# Patient Record
Sex: Male | Born: 1990 | Race: White | Hispanic: Yes | Marital: Single | Smoking: Never smoker
Health system: Southern US, Community
[De-identification: ages and names within clinical notes are randomized; demographics above are authoritative.]

---

## 2017-02-16 ENCOUNTER — Ambulatory Visit (HOSPITAL_COMMUNITY)
Admission: EM | Admit: 2017-02-16 | Discharge: 2017-02-16 | Disposition: A | Discharge status: Home / Self Care | Payer: Federal, State, Local not specified - PPO | Attending: Internal Medicine | Admitting: Internal Medicine

## 2017-02-16 ENCOUNTER — Encounter (HOSPITAL_COMMUNITY): Payer: Self-pay | Admitting: Family Medicine

## 2017-02-16 DIAGNOSIS — L739 Follicular disorder, unspecified: Secondary | ICD-10-CM

## 2017-02-16 DIAGNOSIS — R21 Rash and other nonspecific skin eruption: Secondary | ICD-10-CM | POA: Diagnosis not present

## 2017-02-16 MED ORDER — MUPIROCIN CALCIUM 2 % EX CREA: 1.0000 "application " | TOPICAL_CREAM | Freq: Two times a day (BID) | CUTANEOUS | 0 refills | Status: AC

## 2017-02-16 NOTE — ED Triage Notes (Signed)
Pt here for abscess to right arm and left leg. sts also something that comes and goes on penis.

## 2017-02-16 NOTE — Discharge Instructions (Addendum)
I believe your rash will resolve with antibiotic ointment. The papule on the testicle is most likely folliculitis. Please follow up with your primary care doctor if you do not improve.

## 2017-02-16 NOTE — ED Provider Notes (Signed)
CSN: [id number]     Arrival date & time 02/16/17  1828 History   First MD Initiated Contact with Patient 02/16/17 2022     Chief Complaint  Patient presents with  . Abscess   (Consider location/radiation/quality/duration/timing/severity/associated sxs/prior Treatment) Patient is a healthy 26 y.o. Male, presents today with concern for a skin infection on his right upper arm and left anterior thigh. Patient noticed a small pimple on his thigh 2 days ago but the area is getting more erythematous today and he also noticed the same rash on the arm this morning. He denies fever. Rash is not pruritic. Rash is a slightly sore to the touch but tolerable. Current pain is 0/10.   Patient also reports to have a bump on the bottom of his testicle that comes and goes and this bump would occasionally turn into a erythematous pruritic rash. He would like this bump to be evaluated today as well.       History reviewed. No pertinent past medical history. History reviewed. No pertinent surgical history. History reviewed. No pertinent family history. Social History  Substance Use Topics  . Smoking status: Never Smoker  . Smokeless tobacco: Never Used  . Alcohol use Not on file    Review of Systems  Constitutional:       As stated in the HPI    Allergies  Patient has no known allergies.  Home Medications   Prior to Admission medications   Medication Sig Start Date End Date Taking? Authorizing Provider  mupirocin cream (BACTROBAN) 2 % Apply 1 application topically 2 (two) times daily. 02/16/17 02/23/17  Lucia Estelle, NP   Meds Ordered and Administered this Visit  Medications - No data to display  BP 126/69   Pulse 69   Temp 98.4 F (36.9 C)   Resp 18   SpO2 97%  No data found.   Physical Exam  Constitutional: He is oriented to person, place, and time. He appears well-developed and well-nourished.  Cardiovascular: Normal rate, regular rhythm and normal heart sounds.   Pulmonary/Chest:  Effort normal and breath sounds normal. No respiratory distress.  Abdominal: Soft. Bowel sounds are normal. He exhibits no distension.  Genitourinary:  Genitourinary Comments: Penis normal. No testicular swelling or swelling. No penile rash noted. There is a single small flesh-color papule on the mid bottom testicle at the area of the pubic hair, no inflammation or swelling or discharge.   Neurological: He is alert and oriented to person, place, and time.  Skin: Skin is warm and dry.  See picture below. Exact same rash noted on left anterior thigh.   See Genitourinary for penile exam.   Nursing note and vitals reviewed.     Urgent Care Course     Procedures (including critical care time)  Labs Review Labs Reviewed - No data to display  Imaging Review No results found.  MDM   1. Rash    Differential includes folliculitis or insect bite for the rash on the arm and the thigh. The papule located on the testicle is most likely folliculitis; doubt HSV.   Apply antibiotic ointment to the areas BID x 7 days. Please follow up with your primary care doctor for no improvement.   Cleaning the blade next time prior to shaving. May try to wipe some alcohol after shaving to minimize chance of infection.     Lucia Estelle, NP 02/16/17 2043

## 2017-10-07 ENCOUNTER — Emergency Department (HOSPITAL_COMMUNITY)
Admission: EM | Admit: 2017-10-07 | Discharge: 2017-10-07 | Disposition: A | Discharge status: Home / Self Care | Payer: Worker's Compensation | Attending: Emergency Medicine | Admitting: Emergency Medicine

## 2017-10-07 ENCOUNTER — Emergency Department (HOSPITAL_COMMUNITY): Payer: Worker's Compensation

## 2017-10-07 ENCOUNTER — Encounter (HOSPITAL_COMMUNITY): Payer: Self-pay | Admitting: Emergency Medicine

## 2017-10-07 DIAGNOSIS — M25511 Pain in right shoulder: Secondary | ICD-10-CM | POA: Diagnosis not present

## 2017-10-07 MED ORDER — CYCLOBENZAPRINE HCL 10 MG PO TABS: 10.0000 mg | ORAL_TABLET | Freq: Every evening | ORAL | 0 refills | Status: DC | PRN

## 2017-10-07 MED ORDER — NAPROXEN 500 MG PO TABS: 500.0000 mg | ORAL_TABLET | Freq: Two times a day (BID) | ORAL | 0 refills | Status: DC

## 2017-10-07 NOTE — ED Triage Notes (Signed)
Pt reports he began to have R shoulder pain after lifting a heavy box. Pt has hx of shoulder dislocation, but does not feel that bad.

## 2017-10-07 NOTE — ED Provider Notes (Signed)
[facility]-EMERGENCY DEPT Provider Note   CSN: [id number] Arrival date & time: 10/07/17  1424     History   Chief Complaint Chief Complaint  Patient presents with  . Shoulder Pain    HPI Jeffrey Berger is a 26 y.o. male who presents to the ED with shoulder pain. The pain is located in the right shoulder and started after lifting a heavy box. Patient has hx of shoulder dislocation but states it does not feel that bad. Patient states that he was straining to reach a box on the top shelf and lifted the box up to move it and felt a sharp in in the back of the right shoulder.   HPI  History reviewed. No pertinent past medical history.  There are no active problems to display for this patient.   History reviewed. No pertinent surgical history.     Home Medications    Prior to Admission medications   Medication Sig Start Date End Date Taking? Authorizing Provider  cyclobenzaprine (FLEXERIL) 10 MG tablet Take 1 tablet (10 mg total) by mouth at bedtime as needed for muscle spasms. 10/07/17   Janne NapoleonNeese, Jaeda Bruso M, NP  naproxen (NAPROSYN) 500 MG tablet Take 1 tablet (500 mg total) by mouth 2 (two) times daily. 10/07/17   Janne NapoleonNeese, Kenneisha Cochrane M, NP    Family History History reviewed. No pertinent family history.  Social History Social History   Tobacco Use  . Smoking status: Never Smoker  . Smokeless tobacco: Never Used  Substance Use Topics  . Alcohol use: Not on file  . Drug use: Not on file     Allergies   Patient has no known allergies.   Review of Systems Review of Systems  Musculoskeletal: Positive for arthralgias.       Right shoulder  All other systems reviewed and are negative.    Physical Exam Updated Vital Signs BP 126/86   Pulse 97   Temp 97.8 F (36.6 C) (Oral)   Resp 16   SpO2 96%   Physical Exam  Constitutional: He appears well-developed and well-nourished. No distress.  HENT:  Head: Normocephalic and atraumatic.  Eyes: EOM are  normal.  Neck: Normal range of motion. Neck supple.  Cardiovascular: Normal rate.  Pulmonary/Chest: Effort normal.  Musculoskeletal:       Right shoulder: He exhibits tenderness and spasm. He exhibits normal range of motion, no swelling, no deformity, no laceration, normal pulse and normal strength.  Radial pulses 2+, adequate circulation, grips are equal.   Neurological: He is alert.  Skin: Skin is warm and dry.  Nursing note and vitals reviewed.    ED Treatments / Results  Labs (all labs ordered are listed, but only abnormal results are displayed) Labs Reviewed - No data to display  Radiology Dg Shoulder Right  Result Date: 10/07/2017 CLINICAL DATA:  Injury.  Pain. EXAM: RIGHT SHOULDER - 2+ VIEW COMPARISON:  None. FINDINGS: No acute fracture. No dislocation.  Unremarkable soft tissues. IMPRESSION: No acute bony pathology. Electronically Signed   By: Jolaine ClickArthur  Hoss M.D.   On: 10/07/2017 15:09    Procedures Procedures (including critical care time)  Medications Ordered in ED Medications - No data to display   Initial Impression / Assessment and Plan / ED Course  I have reviewed the triage vital signs and the nursing notes.  26 y.o. male with right shoulder pain and spasm s/p injury while lifting stable for d/c without fracture or dislocation noted on x-ray. Patient without focal neuro  deficits. Return precautions discussed. Will treat for muscle spasm and pain.  Final Clinical Impressions(s) / ED Diagnoses   Final diagnoses:  Acute pain of right shoulder    ED Discharge Orders        Ordered    cyclobenzaprine (FLEXERIL) 10 MG tablet  At bedtime PRN     10/07/17 1842    naproxen (NAPROSYN) 500 MG tablet  2 times daily     10/07/17 1842       Kerrie Buffaloeese, Neriyah Cercone HughesvilleM, NP 10/07/17 1847    Maia PlanLong, Joshua G, MD 10/08/17 1151

## 2018-03-16 IMAGING — CR DG SHOULDER 2+V*R*
3 series · 3 of 3 positions shown · non-contrast
Comparison: None.

CLINICAL DATA: Injury.  Pain.

EXAM:
RIGHT SHOULDER - 2+ VIEW

[w shoulder external right]
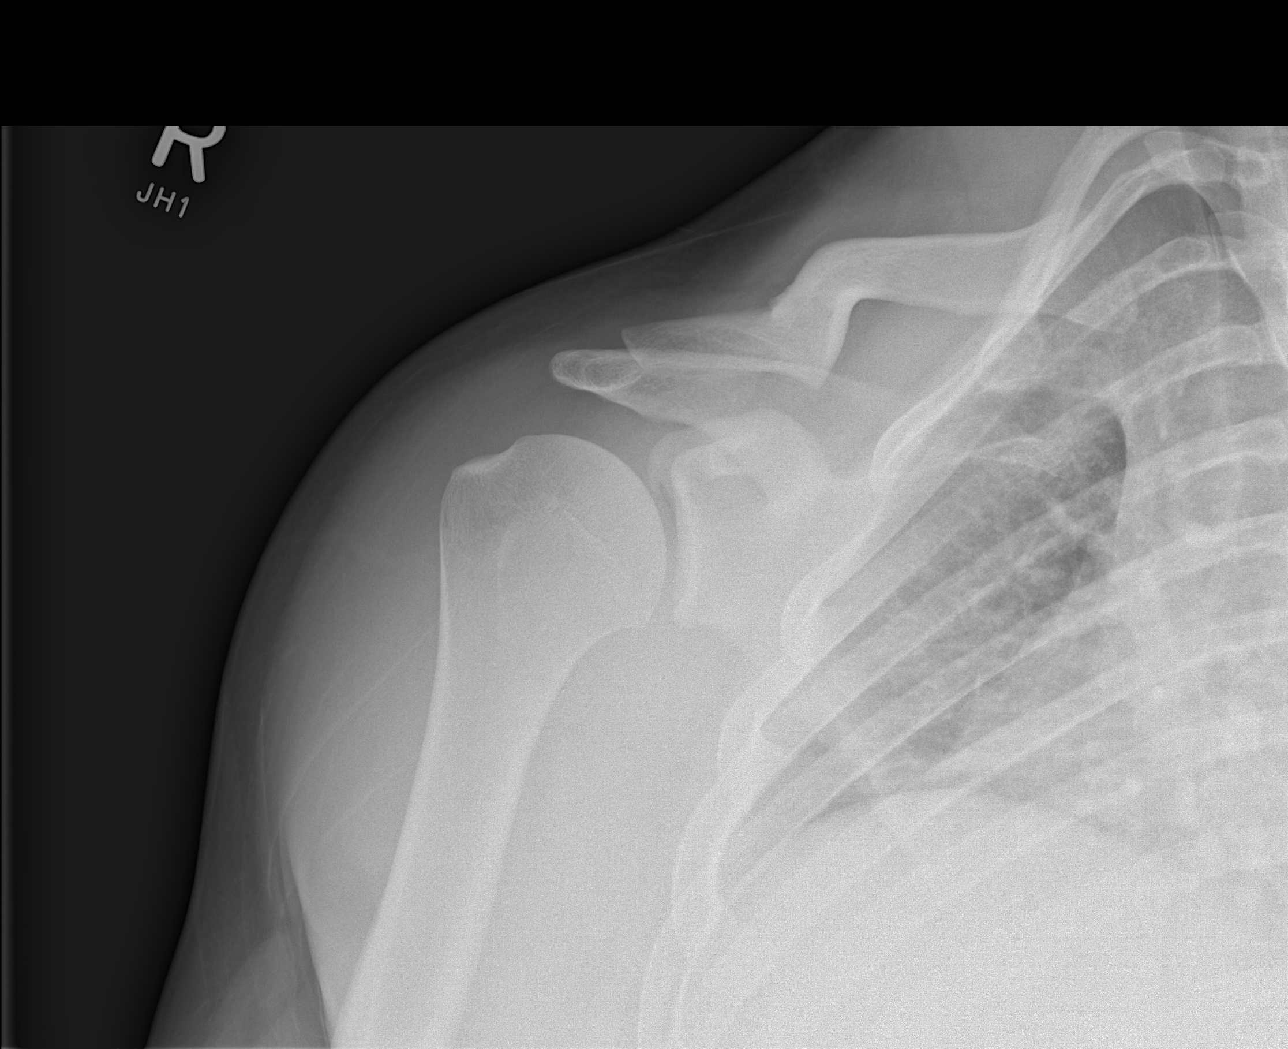

[w shoulder y-view right]
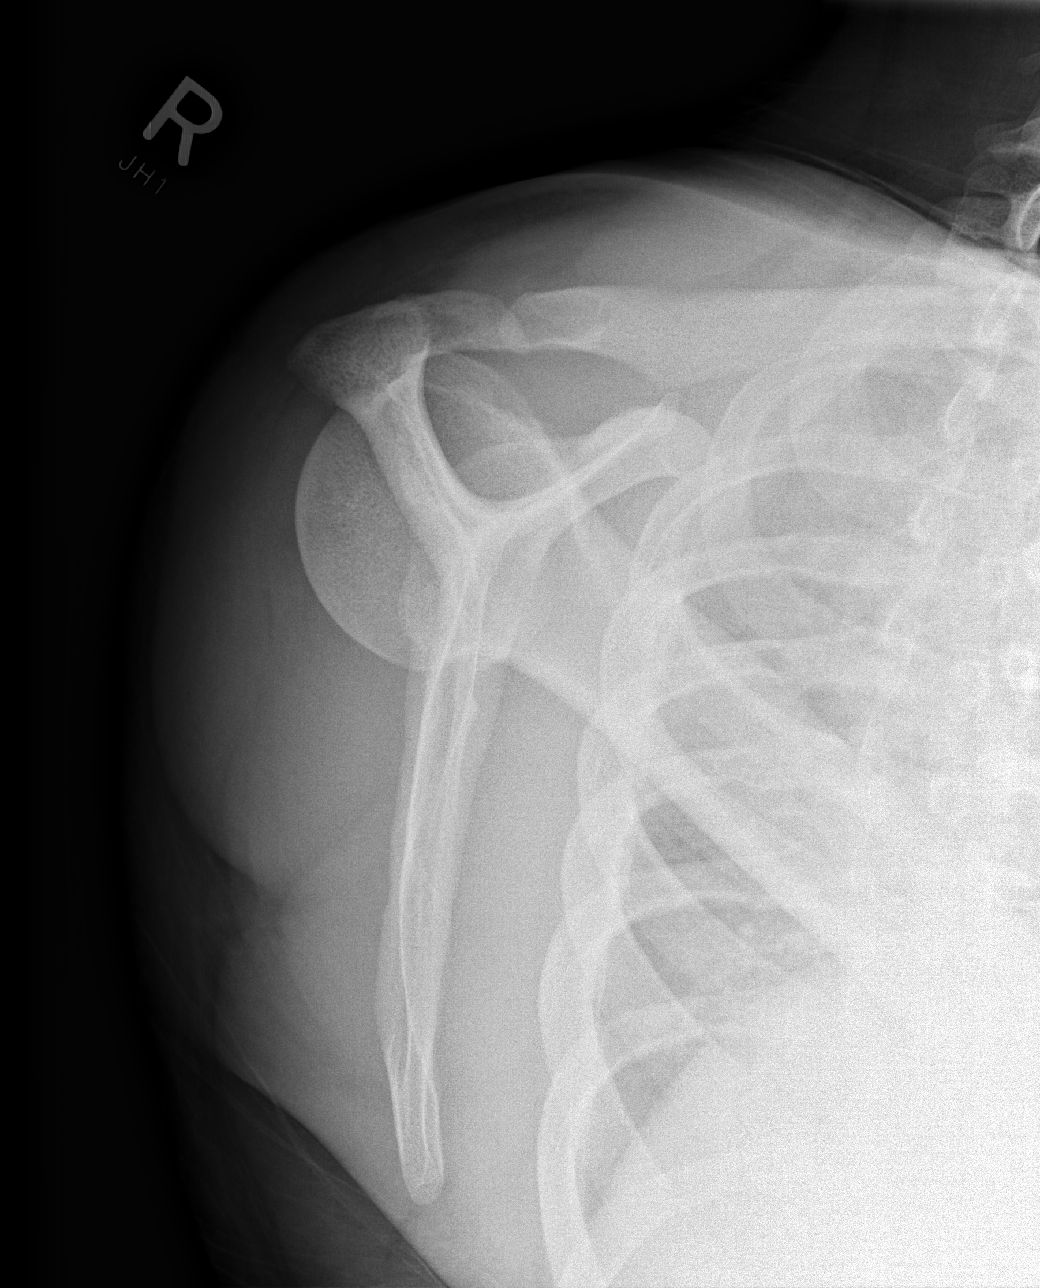

[x shoulder axillary right]
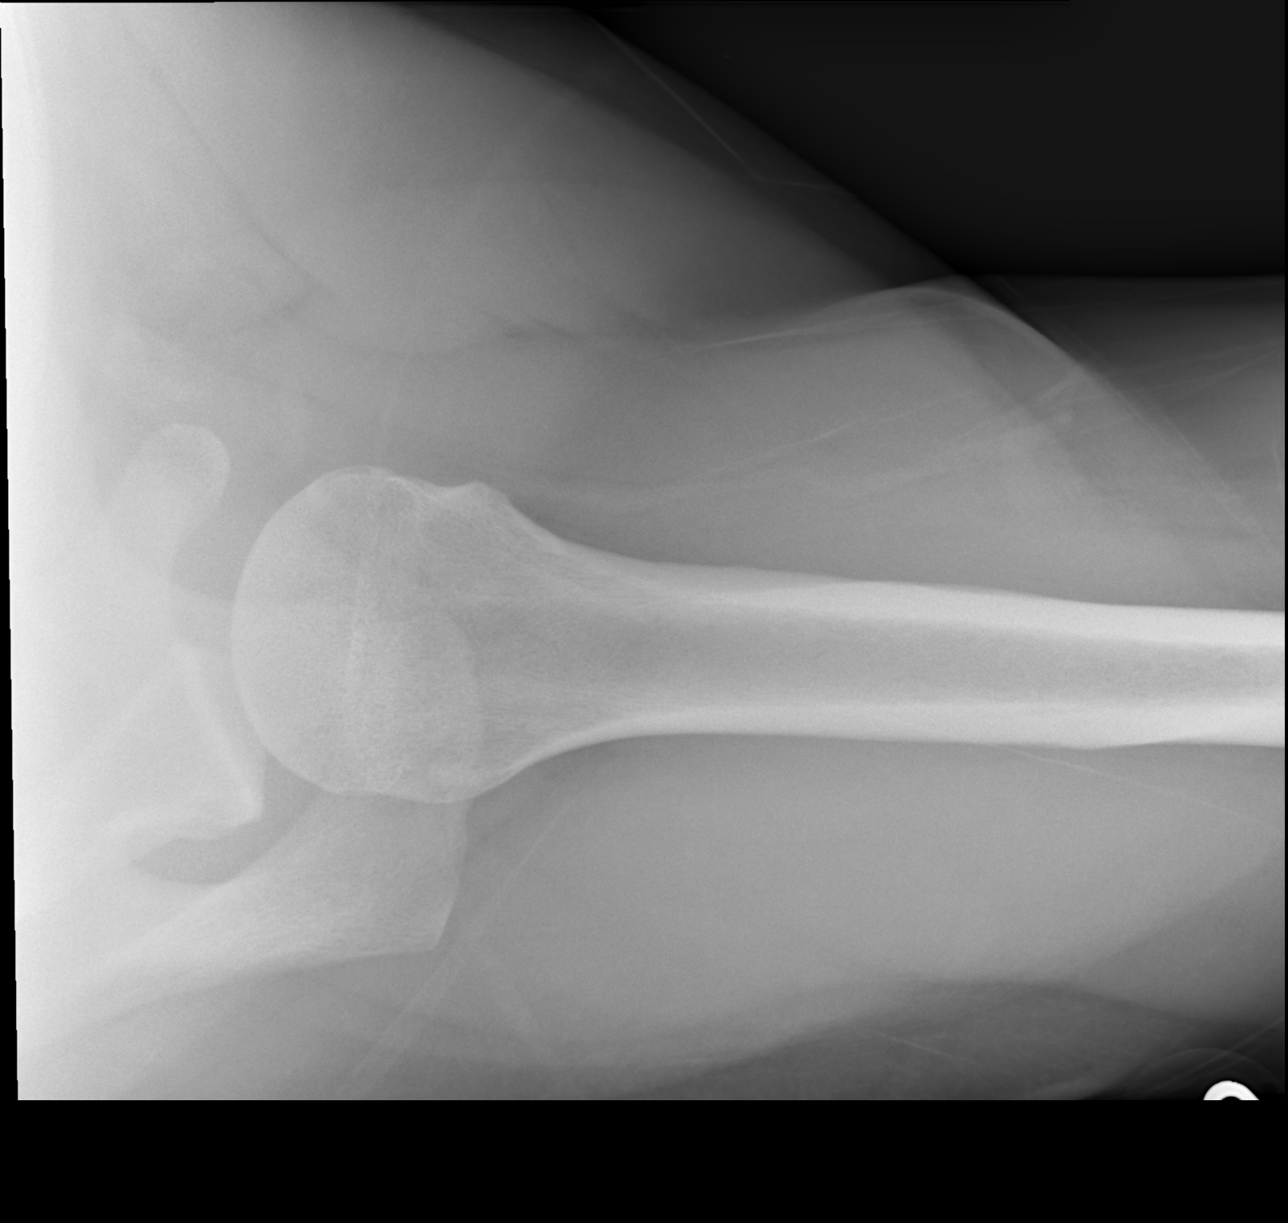

[3 of 3 positions shown; findings below may reference images not displayed]

FINDINGS: No acute fracture. No dislocation.  Unremarkable soft tissues.
IMPRESSION: No acute bony pathology.

## 2019-12-29 ENCOUNTER — Ambulatory Visit (INDEPENDENT_AMBULATORY_CARE_PROVIDER_SITE_OTHER): Payer: Managed Care, Other (non HMO) | Admitting: Family Medicine

## 2019-12-29 ENCOUNTER — Encounter: Payer: Self-pay | Admitting: Family Medicine

## 2019-12-29 ENCOUNTER — Other Ambulatory Visit: Payer: Self-pay

## 2019-12-29 VITALS — BP 122/68 | HR 97 | Temp 97.1°F | Ht 65.0 in | Wt 214.2 lb

## 2019-12-29 DIAGNOSIS — Z23 Encounter for immunization: Secondary | ICD-10-CM | POA: Diagnosis not present

## 2019-12-29 DIAGNOSIS — Z0000 Encounter for general adult medical examination without abnormal findings: Secondary | ICD-10-CM

## 2019-12-29 NOTE — Patient Instructions (Signed)
Health Maintenance, Male Adopting a healthy lifestyle and getting preventive care are important in promoting health and wellness. Ask your health care provider about:  The right schedule for you to have regular tests and exams.  Things you can do on your own to prevent diseases and keep yourself healthy. What should I know about diet, weight, and exercise? Eat a healthy diet   Eat a diet that includes plenty of vegetables, fruits, low-fat dairy products, and lean protein.  Do not eat a lot of foods that are high in solid fats, added sugars, or sodium. Maintain a healthy weight Body mass index (BMI) is a measurement that can be used to identify possible weight problems. It estimates body fat based on height and weight. Your health care provider can help determine your BMI and help you achieve or maintain a healthy weight. Get regular exercise Get regular exercise. This is one of the most important things you can do for your health. Most adults should:  Exercise for at least 150 minutes each week. The exercise should increase your heart rate and make you sweat (moderate-intensity exercise).  Do strengthening exercises at least twice a week. This is in addition to the moderate-intensity exercise.  Spend less time sitting. Even light physical activity can be beneficial. Watch cholesterol and blood lipids Have your blood tested for lipids and cholesterol at 27 years of age, then have this test every 5 years. You may need to have your cholesterol levels checked more often if:  Your lipid or cholesterol levels are high.  You are older than 27 years of age.  You are at high risk for heart disease. What should I know about cancer screening? Many types of cancers can be detected early and may often be prevented. Depending on your health history and family history, you may need to have cancer screening at various ages. This may include screening for:  Colorectal cancer.  Prostate  cancer.  Skin cancer.  Lung cancer. What should I know about heart disease, diabetes, and high blood pressure? Blood pressure and heart disease  High blood pressure causes heart disease and increases the risk of stroke. This is more likely to develop in people who have high blood pressure readings, are of African descent, or are overweight.  Talk with your health care provider about your target blood pressure readings.  Have your blood pressure checked: ? Every 3-5 years if you are 27-39 years of age. ? Every year if you are 40 years old or older.  If you are between the ages of 65 and 75 and are a current or former smoker, ask your health care provider if you should have a one-time screening for abdominal aortic aneurysm (AAA). Diabetes Have regular diabetes screenings. This checks your fasting blood sugar level. Have the screening done:  Once every three years after age 45 if you are at a normal weight and have a low risk for diabetes.  More often and at a younger age if you are overweight or have a high risk for diabetes. What should I know about preventing infection? Hepatitis B If you have a higher risk for hepatitis B, you should be screened for this virus. Talk with your health care provider to find out if you are at risk for hepatitis B infection. Hepatitis C Blood testing is recommended for:  Everyone born from 1945 through 1965.  Anyone with known risk factors for hepatitis C. Sexually transmitted infections (STIs)  You should be screened each year   for STIs, including gonorrhea and chlamydia, if: ? You are sexually active and are younger than 27 years of age. ? You are older than 27 years of age and your health care provider tells you that you are at risk for this type of infection. ? Your sexual activity has changed since you were last screened, and you are at increased risk for chlamydia or gonorrhea. Ask your health care provider if you are at risk.  Ask your  health care provider about whether you are at high risk for HIV. Your health care provider may recommend a prescription medicine to help prevent HIV infection. If you choose to take medicine to prevent HIV, you should first get tested for HIV. You should then be tested every 3 months for as long as you are taking the medicine. Follow these instructions at home: Lifestyle  Do not use any products that contain nicotine or tobacco, such as cigarettes, e-cigarettes, and chewing tobacco. If you need help quitting, ask your health care provider.  Do not use street drugs.  Do not share needles.  Ask your health care provider for help if you need support or information about quitting drugs. Alcohol use  Do not drink alcohol if your health care provider tells you not to drink.  If you drink alcohol: ? Limit how much you have to 0-2 drinks a day. ? Be aware of how much alcohol is in your drink. In the [place] one drink equals one 12 oz bottle of beer (355 mL), one 5 oz glass of wine (148 mL), or one 1 oz glass of hard liquor (44 mL). General instructions  Schedule regular health, dental, and eye exams.  Stay current with your vaccines.  Tell your health care provider if: ? You often feel depressed. ? You have ever been abused or do not feel safe at home. Summary  Adopting a healthy lifestyle and getting preventive care are important in promoting health and wellness.  Follow your health care provider's instructions about healthy diet, exercising, and getting tested or screened for diseases.  Follow your health care provider's instructions on monitoring your cholesterol and blood pressure. This information is not intended to replace advice given to you by your health care provider. Make sure you discuss any questions you have with your health care provider. Document Revised: 10/22/2018 Document Reviewed: 10/22/2018 Elsevier Patient Education  2020 [employer]vier Inc.  Preventive Care 21-39 Years  Old, Male Preventive care refers to lifestyle choices and visits with your health care provider that can promote health and wellness. This includes:  A yearly physical exam. This is also called an annual well check.  Regular dental and eye exams.  Immunizations.  Screening for certain conditions.  Healthy lifestyle choices, such as eating a healthy diet, getting regular exercise, not using drugs or products that contain nicotine and tobacco, and limiting alcohol use. What can I expect for my preventive care visit? Physical exam Your health care provider will check:  Height and weight. These may be used to calculate body mass index (BMI), which is a measurement that tells if you are at a healthy weight.  Heart rate and blood pressure.  Your skin for abnormal spots. Counseling Your health care provider may ask you questions about:  Alcohol, tobacco, and drug use.  Emotional well-being.  Home and relationship well-being.  Sexual activity.  Eating habits.  Work and work environment. What immunizations do I need?  Influenza (flu) vaccine  This is recommended every year. Tetanus, diphtheria,   and pertussis (Tdap) vaccine  You may need a Td booster every 10 years. Varicella (chickenpox) vaccine  You may need this vaccine if you have not already been vaccinated. Human papillomavirus (HPV) vaccine  If recommended by your health care provider, you may need three doses over 6 months. Measles, mumps, and rubella (MMR) vaccine  You may need at least one dose of MMR. You may also need a second dose. Meningococcal conjugate (MenACWY) vaccine  One dose is recommended if you are 73-28 years old and a Market researcher living in a residence hall, or if you have one of several medical conditions. You may also need additional booster doses. Pneumococcal conjugate (PCV13) vaccine  You may need this if you have certain conditions and were not previously  vaccinated. Pneumococcal polysaccharide (PPSV23) vaccine  You may need one or two doses if you smoke cigarettes or if you have certain conditions. Hepatitis A vaccine  You may need this if you have certain conditions or if you travel or work in places where you may be exposed to hepatitis A. Hepatitis B vaccine  You may need this if you have certain conditions or if you travel or work in places where you may be exposed to hepatitis B. Haemophilus influenzae type b (Hib) vaccine  You may need this if you have certain risk factors. You may receive vaccines as individual doses or as more than one vaccine together in one shot (combination vaccines). Talk with your health care provider about the risks and benefits of combination vaccines. What tests do I need? Blood tests  Lipid and cholesterol levels. These may be checked every 5 years starting at age 80.  Hepatitis C test.  Hepatitis B test. Screening   Diabetes screening. This is done by checking your blood sugar (glucose) after you have not eaten for a while (fasting).  Sexually transmitted disease (STD) testing. Talk with your health care provider about your test results, treatment options, and if necessary, the need for more tests. Follow these instructions at home: Eating and drinking   Eat a diet that includes fresh fruits and vegetables, whole grains, lean protein, and low-fat dairy products.  Take vitamin and mineral supplements as recommended by your health care provider.  Do not drink alcohol if your health care provider tells you not to drink.  If you drink alcohol: ? Limit how much you have to 0-2 drinks a day. ? Be aware of how much alcohol is in your drink. In the [place] one drink equals one 12 oz bottle of beer (355 mL), one 5 oz glass of wine (148 mL), or one 1 oz glass of hard liquor (44 mL). Lifestyle  Take daily care of your teeth and gums.  Stay active. Exercise for at least 30 minutes on 5 or more days  each week.  Do not use any products that contain nicotine or tobacco, such as cigarettes, e-cigarettes, and chewing tobacco. If you need help quitting, ask your health care provider.  If you are sexually active, practice safe sex. Use a condom or other form of protection to prevent STIs (sexually transmitted infections). What's next?  Go to your health care provider once a year for a well check visit.  Ask your health care provider how often you should have your eyes and teeth checked.  Stay up to date on all vaccines. This information is not intended to replace advice given to you by your health care provider. Make sure you discuss any questions you  have with your health care provider. Document Revised: 10/23/2018 Document Reviewed: 10/23/2018 Elsevier Patient Education  2020 Elsevier Inc.  Exercising to Lose Weight Exercise is structured, repetitive physical activity to improve fitness and health. Getting regular exercise is important for everyone. It is especially important if you are overweight. Being overweight increases your risk of heart disease, stroke, diabetes, high blood pressure, and several types of cancer. Reducing your calorie intake and exercising can help you lose weight. Exercise is usually categorized as moderate or vigorous intensity. To lose weight, most people need to do a certain amount of moderate-intensity or vigorous-intensity exercise each week. Moderate-intensity exercise  Moderate-intensity exercise is any activity that gets you moving enough to burn at least three times more energy (calories) than if you were sitting. Examples of moderate exercise include:  Walking a mile in 15 minutes.  Doing light yard work.  Biking at an easy pace. Most people should get at least 150 minutes (2 hours and 30 minutes) a week of moderate-intensity exercise to maintain their body weight. Vigorous-intensity exercise Vigorous-intensity exercise is any activity that gets you  moving enough to burn at least six times more calories than if you were sitting. When you exercise at this intensity, you should be working hard enough that you are not able to carry on a conversation. Examples of vigorous exercise include:  Running.  Playing a team sport, such as football, basketball, and soccer.  Jumping rope. Most people should get at least 75 minutes (1 hour and 15 minutes) a week of vigorous-intensity exercise to maintain their body weight. How can exercise affect me? When you exercise enough to burn more calories than you eat, you lose weight. Exercise also reduces body fat and builds muscle. The more muscle you have, the more calories you burn. Exercise also:  Improves mood.  Reduces stress and tension.  Improves your overall fitness, flexibility, and endurance.  Increases bone strength. The amount of exercise you need to lose weight depends on:  Your age.  The type of exercise.  Any health conditions you have.  Your overall physical ability. Talk to your health care provider about how much exercise you need and what types of activities are safe for you. What actions can I take to lose weight? Nutrition   Make changes to your diet as told by your health care provider or diet and nutrition specialist (dietitian). This may include: ? Eating fewer calories. ? Eating more protein. ? Eating less unhealthy fats. ? Eating a diet that includes fresh fruits and vegetables, whole grains, low-fat dairy products, and lean protein. ? Avoiding foods with added fat, salt, and sugar.  Drink plenty of water while you exercise to prevent dehydration or heat stroke. Activity  Choose an activity that you enjoy and set realistic goals. Your health care provider can help you make an exercise plan that works for you.  Exercise at a moderate or vigorous intensity most days of the week. ? The intensity of exercise may vary from person to person. You can tell how intense a  workout is for you by paying attention to your breathing and heartbeat. Most people will notice their breathing and heartbeat get faster with more intense exercise.  Do resistance training twice each week, such as: ? Push-ups. ? Sit-ups. ? Lifting weights. ? Using resistance bands.  Getting short amounts of exercise can be just as helpful as long structured periods of exercise. If you have trouble finding time to exercise, try to include exercise   in your daily routine. ? Get up, stretch, and walk around every 30 minutes throughout the day. ? Go for a walk during your lunch break. ? Park your car farther away from your destination. ? If you take public transportation, get off one stop early and walk the rest of the way. ? Make phone calls while standing up and walking around. ? Take the stairs instead of elevators or escalators.  Wear comfortable clothes and shoes with good support.  Do not exercise so much that you hurt yourself, feel dizzy, or get very short of breath. Where to find more information  [place] Department of Health and Human Services: www.hhs.gov  Centers for Disease Control and Prevention (CDC): www.cdc.gov Contact a health care provider:  Before starting a new exercise program.  If you have questions or concerns about your weight.  If you have a medical problem that keeps you from exercising. Get help right away if you have any of the following while exercising:  Injury.  Dizziness.  Difficulty breathing or shortness of breath that does not go away when you stop exercising.  Chest pain.  Rapid heartbeat. Summary  Being overweight increases your risk of heart disease, stroke, diabetes, high blood pressure, and several types of cancer.  Losing weight happens when you burn more calories than you eat.  Reducing the amount of calories you eat in addition to getting regular moderate or vigorous exercise each week helps you lose weight. This information is not  intended to replace advice given to you by your health care provider. Make sure you discuss any questions you have with your health care provider. Document Revised: 11/11/2017 Document Reviewed: 11/11/2017 Elsevier Patient Education  2020 [employer] Inc.  

## 2019-12-29 NOTE — Progress Notes (Signed)
New Patient Office Visit  Subjective:  Patient ID: Jeffrey Berger, male    DOB: 19-Mar-1991  Age: 29 y.o. MRN: [id number]  CC:  Chief Complaint  Patient presents with  . Establish Care    New patient CPE, no concerns     HPI Jeffrey Berger presents for establishment of care and a complete physical.  Jeffrey Berger is healthy as far as Jeffrey Berger knows.  Recently married and working in [employer] business.  Mom is alive and suffers from anxiety and depression due to a year family stress.  Dad lives in [place] and his health history is unknown.  Patient has been losing weight with calorie restriction.  Jeffrey Berger is is concerned about wax buildup in his ears.  Denies headaches, dizziness or lightheadedness.  No changes in any moles or growths on his skin.  History reviewed. No pertinent past medical history.  History reviewed. No pertinent surgical history.  Family History  Problem Relation Age of Onset  . Anxiety disorder Mother   . Diabetes Father   . Cancer Maternal Grandfather     Social History   Socioeconomic History  . Marital status: Single    Spouse name: Not on file  . Number of children: Not on file  . Years of education: Not on file  . Highest education level: Not on file  Occupational History  . Not on file  Tobacco Use  . Smoking status: Never Smoker  . Smokeless tobacco: Never Used  Substance and Sexual Activity  . Alcohol use: Yes    Comment: social  . Drug use: Never  . Sexual activity: Yes  Other Topics Concern  . Not on file  Social History Narrative  . Not on file   Social Determinants of Health   Financial Resource Strain:   . Difficulty of Paying Living Expenses: Not on file  Food Insecurity:   . Worried About Programme researcher, broadcasting/film/video in the Last Year: Not on file  . Ran Out of Food in the Last Year: Not on file  Transportation Needs:   . Lack of Transportation (Medical): Not on file  . Lack of Transportation (Non-Medical): Not on file  Physical Activity:   .  Days of Exercise per Week: Not on file  . Minutes of Exercise per Session: Not on file  Stress:   . Feeling of Stress : Not on file  Social Connections:   . Frequency of Communication with Friends and Family: Not on file  . Frequency of Social Gatherings with Friends and Family: Not on file  . Attends Religious Services: Not on file  . Active Member of Clubs or Organizations: Not on file  . Attends Banker Meetings: Not on file  . Marital Status: Not on file  Intimate Partner Violence:   . Fear of Current or Ex-Partner: Not on file  . Emotionally Abused: Not on file  . Physically Abused: Not on file  . Sexually Abused: Not on file    ROS Review of Systems  Constitutional: Negative.   HENT: Negative.   Eyes: Negative for photophobia and visual disturbance.  Respiratory: Negative.   Cardiovascular: Negative.   Gastrointestinal: Negative.   Endocrine: Negative for polyphagia and polyuria.  Genitourinary: Negative.   Musculoskeletal: Negative for gait problem and joint swelling.  Skin: Negative for color change, pallor and rash.  Neurological: Negative for dizziness, speech difficulty and headaches.  Hematological: Does not bruise/bleed easily.  Psychiatric/Behavioral: Negative.     Objective:  Today's Vitals: BP 122/68   Pulse 97   Temp (!) 97.1 F (36.2 C) (Tympanic)   Ht 5\' 5"  (1.651 m)   Wt 214 lb 3.2 oz (97.2 kg)   SpO2 95%   BMI 35.64 kg/m   Physical Exam Vitals and nursing note reviewed.  Constitutional:      General: Jeffrey Berger is not in acute distress.    Appearance: Normal appearance. Jeffrey Berger is obese. Jeffrey Berger is not ill-appearing, toxic-appearing or diaphoretic.  HENT:     Head: Normocephalic and atraumatic.     Right Ear: Tympanic membrane, ear canal and external ear normal. There is no impacted cerumen.     Left Ear: Tympanic membrane, ear canal and external ear normal. There is no impacted cerumen.     Nose: No congestion or rhinorrhea.  Eyes:      General:        Right eye: No discharge.        Left eye: No discharge.     Extraocular Movements: Extraocular movements intact.     Conjunctiva/sclera: Conjunctivae normal.     Pupils: Pupils are equal, round, and reactive to light.  Cardiovascular:     Rate and Rhythm: Normal rate and regular rhythm.  Pulmonary:     Effort: Pulmonary effort is normal. No respiratory distress.     Breath sounds: Normal breath sounds. No stridor. No wheezing or rhonchi.  Abdominal:     General: Abdomen is flat. Bowel sounds are normal. There is no distension.     Palpations: Abdomen is soft. There is no mass.     Tenderness: There is no abdominal tenderness. There is no guarding or rebound.     Hernia: No hernia is present. There is no hernia in the left inguinal area or right inguinal area.  Genitourinary:    Penis: Uncircumcised. No phimosis, paraphimosis, hypospadias, erythema, tenderness, discharge, swelling or lesions.      Testes:        Right: Mass, tenderness, swelling, testicular hydrocele or varicocele not present. Right testis is descended.        Left: Mass, tenderness, swelling, testicular hydrocele or varicocele not present. Left testis is descended.     Epididymis:     Right: Not inflamed or enlarged. No mass or tenderness.     Left: Not inflamed or enlarged. No mass or tenderness.  Musculoskeletal:     Cervical back: Neck supple. No rigidity or tenderness.  Lymphadenopathy:     Cervical: No cervical adenopathy.     Lower Body: No right inguinal adenopathy. No left inguinal adenopathy.  Skin:    General: Skin is warm and dry.  Neurological:     Mental Status: Jeffrey Berger is alert and oriented to person, place, and time.  Psychiatric:        Mood and Affect: Mood normal.        Behavior: Behavior normal.     Assessment & Plan:   Problem List Items Addressed This Visit      Other   Healthcare maintenance   Relevant Orders   CBC   Comprehensive metabolic panel   HIV Antibody  (routine testing w rflx)   Lipid panel   Urinalysis, Routine w reflex microscopic   Need for influenza vaccination - Primary   Relevant Orders   Flu Vaccine QUAD 6+ mos PF IM (Fluarix Quad PF) (Completed)   Need for Tdap vaccination   Relevant Orders   Tdap vaccine greater than or equal to 7yo IM (Completed)  Outpatient Encounter Medications as of 12/29/2019  Medication Sig  . naproxen (NAPROSYN) 500 MG tablet Take 1 tablet (500 mg total) by mouth 2 (two) times daily.  . [DISCONTINUED] cyclobenzaprine (FLEXERIL) 10 MG tablet Take 1 tablet (10 mg total) by mouth at bedtime as needed for muscle spasms. (Patient not taking: Reported on 12/29/2019)   No facility-administered encounter medications on file as of 12/29/2019.    Follow-up: Return in about 1 year (around 12/28/2020), or if symptoms worsen or fail to improve.   Patient was given information on health maintenance and disease prevention as well as exercising to lose weight.  Encouraged him to continue losing weight. Will return fasting for blood work.   Libby Maw, MD

## 2020-01-05 ENCOUNTER — Other Ambulatory Visit: Payer: Self-pay

## 2020-01-05 ENCOUNTER — Other Ambulatory Visit (INDEPENDENT_AMBULATORY_CARE_PROVIDER_SITE_OTHER): Payer: Managed Care, Other (non HMO)

## 2020-01-05 DIAGNOSIS — Z0000 Encounter for general adult medical examination without abnormal findings: Secondary | ICD-10-CM

## 2020-01-05 LAB — URINALYSIS, ROUTINE W REFLEX MICROSCOPIC
Bilirubin Urine: NEGATIVE
Hgb urine dipstick: NEGATIVE
Ketones, ur: NEGATIVE
Leukocytes,Ua: NEGATIVE
Nitrite: NEGATIVE
RBC / HPF: NONE SEEN (ref 0–?)
Specific Gravity, Urine: 1.02 (ref 1.000–1.030)
Total Protein, Urine: NEGATIVE
Urine Glucose: NEGATIVE
Urobilinogen, UA: 0.2 (ref 0.0–1.0)
WBC, UA: NONE SEEN (ref 0–?)
pH: 7 (ref 5.0–8.0)

## 2020-01-05 LAB — COMPREHENSIVE METABOLIC PANEL
ALT: 24 U/L (ref 0–53)
AST: 19 U/L (ref 0–37)
Albumin: 4.9 g/dL (ref 3.5–5.2)
Alkaline Phosphatase: 48 U/L (ref 39–117)
BUN: 17 mg/dL (ref 6–23)
CO2: 26 mEq/L (ref 19–32)
Calcium: 9.8 mg/dL (ref 8.4–10.5)
Chloride: 104 mEq/L (ref 96–112)
Creatinine, Ser: 1.04 mg/dL (ref 0.40–1.50)
GFR: 85.87 mL/min (ref >60.00)
Glucose, Bld: 90 mg/dL (ref 70–99)
Potassium: 4 mEq/L (ref 3.5–5.1)
Sodium: 137 mEq/L (ref 135–145)
Total Bilirubin: 0.5 mg/dL (ref 0.2–1.2)
Total Protein: 7.5 g/dL (ref 6.0–8.3)

## 2020-01-05 LAB — CBC
HCT: 50.4 % (ref 39.0–52.0)
Hemoglobin: 17.2 g/dL — ABNORMAL HIGH (ref 13.0–17.0)
MCHC: 34.2 g/dL (ref 30.0–36.0)
MCV: 85.5 fl (ref 78.0–100.0)
Platelets: 259 10*3/uL (ref 150.0–400.0)
RBC: 5.9 Mil/uL — ABNORMAL HIGH (ref 4.22–5.81)
RDW: 12.9 % (ref 11.5–15.5)
WBC: 5.9 10*3/uL (ref 4.0–10.5)

## 2020-01-05 LAB — LIPID PANEL
Cholesterol: 161 mg/dL (ref 0–200)
HDL: 31.8 mg/dL — ABNORMAL LOW (ref >39.00)
LDL Cholesterol: 97 mg/dL (ref 0–99)
NonHDL: 129.03
Total CHOL/HDL Ratio: 5
Triglycerides: 162 mg/dL — ABNORMAL HIGH (ref 0.0–149.0)
VLDL: 32.4 mg/dL (ref 0.0–40.0)

## 2020-01-06 LAB — HIV ANTIBODY (ROUTINE TESTING W REFLEX): HIV 1&2 Ab, 4th Generation: NONREACTIVE

## 2020-03-21 ENCOUNTER — Other Ambulatory Visit: Payer: Self-pay

## 2020-03-22 ENCOUNTER — Ambulatory Visit (INDEPENDENT_AMBULATORY_CARE_PROVIDER_SITE_OTHER): Payer: Managed Care, Other (non HMO) | Admitting: Family Medicine

## 2020-03-22 ENCOUNTER — Encounter: Payer: Self-pay | Admitting: Family Medicine

## 2020-03-22 VITALS — BP 108/74 | HR 68 | Temp 98.2°F | Ht 65.0 in | Wt 208.8 lb

## 2020-03-22 DIAGNOSIS — S46911A Strain of unspecified muscle, fascia and tendon at shoulder and upper arm level, right arm, initial encounter: Secondary | ICD-10-CM

## 2020-03-22 MED ORDER — MELOXICAM 7.5 MG PO TABS: 7.5000 mg | ORAL_TABLET | Freq: Every day | ORAL | 0 refills | Status: DC

## 2020-03-22 NOTE — Progress Notes (Signed)
Established Patient Office Visit  Subjective:  Patient ID: Jeffrey Berger, male    DOB: [birth year]  Age: 29 y.o. MRN: [id number]  CC:  Chief Complaint  Patient presents with  . Arm Pain    C/O right arm pain x 1 month.     HPI Boyde Grieco presents for evaluation treatment of 2 to 3-week history of right arm pain.  No specific injury but he does engage in heavy lifting at work.  Right-hand-dominant.  Patient denies weakness numbness or tingling.  No prior injury history.  He has not tried any medicines for this.  He is not working out in a gym right now.  No past medical history on file.  No past surgical history on file.  Family History  Problem Relation Age of Onset  . Anxiety disorder Mother   . Diabetes Father   . Cancer Maternal Grandfather     Social History   Socioeconomic History  . Marital status: Single    Spouse name: Not on file  . Number of children: Not on file  . Years of education: Not on file  . Highest education level: Not on file  Occupational History  . Not on file  Tobacco Use  . Smoking status: Never Smoker  . Smokeless tobacco: Never Used  Substance and Sexual Activity  . Alcohol use: Yes    Comment: rarely for special occassions only  . Drug use: Never  . Sexual activity: Yes  Other Topics Concern  . Not on file  Social History Narrative  . Not on file   Social Determinants of Health   Financial Resource Strain:   . Difficulty of Paying Living Expenses:   Food Insecurity:   . Worried About Programme researcher, broadcasting/film/video in the Last Year:   . Barista in the Last Year:   Transportation Needs:   . Freight forwarder (Medical):   Marland Kitchen Lack of Transportation (Non-Medical):   Physical Activity:   . Days of Exercise per Week:   . Minutes of Exercise per Session:   Stress:   . Feeling of Stress :   Social Connections:   . Frequency of Communication with Friends and Family:   . Frequency of Social Gatherings with Friends and Family:     . Attends Religious Services:   . Active Member of Clubs or Organizations:   . Attends Banker Meetings:   Marland Kitchen Marital Status:   Intimate Partner Violence:   . Fear of Current or Ex-Partner:   . Emotionally Abused:   Marland Kitchen Physically Abused:   . Sexually Abused:     Outpatient Medications Prior to Visit  Medication Sig Dispense Refill  . naproxen (NAPROSYN) 500 MG tablet Take 1 tablet (500 mg total) by mouth 2 (two) times daily. (Patient not taking: Reported on 03/22/2020) 20 tablet 0   No facility-administered medications prior to visit.    No Known Allergies  ROS Review of Systems  Constitutional: Negative.   Respiratory: Negative.   Cardiovascular: Negative.   Gastrointestinal: Negative.   Musculoskeletal: Positive for myalgias. Negative for arthralgias.  Neurological: Negative for weakness, numbness and headaches.  Psychiatric/Behavioral: Negative.       Objective:    Physical Exam  Constitutional: He appears well-developed and well-nourished. No distress.  HENT:  Head: Normocephalic and atraumatic.  Right Ear: External ear normal.  Left Ear: External ear normal.  Mouth/Throat: No oropharyngeal exudate.  Eyes: Conjunctivae are normal.  Neck: No JVD present. No  tracheal deviation present.  Cardiovascular:  Pulses:      Radial pulses are 2+ on the right side.  Pulmonary/Chest: Effort normal. No stridor.  Musculoskeletal:     Right elbow: No swelling or effusion. Normal range of motion. Tenderness present in radial head. No medial epicondyle or lateral epicondyle tenderness.       Arms:  Skin: He is not diaphoretic.    BP 108/74   Pulse 68   Temp 98.2 F (36.8 C) (Tympanic)   Ht 5\' 5"  (1.651 m)   Wt 208 lb 12.8 oz (94.7 kg)   SpO2 94%   BMI 34.75 kg/m  Wt Readings from Last 3 Encounters:  03/22/20 208 lb 12.8 oz (94.7 kg)  12/29/19 214 lb 3.2 oz (97.2 kg)     Health Maintenance Due  Topic Date Due  . COVID-19 Vaccine (1) Never done     There are no preventive care reminders to display for this patient.  No results found for: TSH Lab Results  Component Value Date   WBC 5.9 01/05/2020   HGB 17.2 (H) 01/05/2020   HCT 50.4 01/05/2020   MCV 85.5 01/05/2020   PLT 259.0 01/05/2020   Lab Results  Component Value Date   NA 137 01/05/2020   K 4.0 01/05/2020   CO2 26 01/05/2020   GLUCOSE 90 01/05/2020   BUN 17 01/05/2020   CREATININE 1.04 01/05/2020   BILITOT 0.5 01/05/2020   ALKPHOS 48 01/05/2020   AST 19 01/05/2020   ALT 24 01/05/2020   PROT 7.5 01/05/2020   ALBUMIN 4.9 01/05/2020   CALCIUM 9.8 01/05/2020   GFR 85.87 01/05/2020   Lab Results  Component Value Date   CHOL 161 01/05/2020   Lab Results  Component Value Date   HDL 31.80 (L) 01/05/2020   Lab Results  Component Value Date   LDLCALC 97 01/05/2020   Lab Results  Component Value Date   TRIG 162.0 (H) 01/05/2020   Lab Results  Component Value Date   CHOLHDL 5 01/05/2020   No results found for: HGBA1C    Assessment & Plan:   Problem List Items Addressed This Visit    None    Visit Diagnoses    Muscle strain of right upper arm, initial encounter    -  Primary   Relevant Medications   meloxicam (MOBIC) 7.5 MG tablet   Other Relevant Orders   Ambulatory referral to Sports Medicine      Meds ordered this encounter  Medications  . meloxicam (MOBIC) 7.5 MG tablet    Sig: Take 1 tablet (7.5 mg total) by mouth daily.    Dispense:  30 tablet    Refill:  0    Follow-up: No follow-ups on file.   Brachialis strain?  Climbers elbow? Libby Maw, MD

## 2020-03-22 NOTE — Patient Instructions (Addendum)
Climber's Elbow  Climber's elbow is an elbow injury that results from long-lasting (chronic) inflammation or strain of the inner muscles in your upper arms (brachialis muscles). Your brachialis muscles help your arms bend (flex) at the elbow. This is a common overuse injury among climbers who constantly overstretch (overextend) or flex their arms over their heads. This condition is also called brachialis muscle strain or anterior capsule elbow sprain. This condition causes pain that gets worse over time and makes it difficult to straighten your arms. What are the causes? This condition is caused by chronic inflammation of the upper arm muscles from continuous overhead arm motion. This type of injury develops gradually over time (overuse injury) from repeated overextension or repetitive, forceful flexing of your upper arm muscles. This causes chronic inflammation in one or both muscles. Strained muscles may be twisted, pulled, or torn. The tissue that connects these muscles to bone (tendons) may also become inflamed (tendinitis). What increases the risk? You are more likely to develop this condition if you have a job or play a sport that requires constant overhead arm motions that involve repetitive flexing and extending (straightening) of the elbow. This includes being an athlete who:  Climbs.  Swims.  Plays tennis, baseball, or bowling.  Lifts weights. What are the signs or symptoms? Symptoms of this condition include:  Pain in the upper arm and elbow.  Arm pain that gets worse when flexing and straightening the elbow.  Redness or warmth at the front of the elbow.  A noise like a pop or a snap when moving or touching the elbow.  Muscle spasms in the upper arm.  Loss of arm strength or range of motion. How is this diagnosed? This condition is diagnosed based on:  Your symptoms.  Your medical history.  A physical exam. During the exam, you may be asked to move your hand, fingers,  wrist, and elbow in certain ways to help your health care provider find the source of your injury. You may also have imaging studies to confirm the diagnosis and to find out more about your condition, such as:  X-rays to check for broken bones.  An MRI to check for tears in the ligaments, muscles, or tendons. How is this treated? This condition is treated by:  Stopping or reducing all activities that cause arm or elbow pain until your symptoms go away.  Icing your elbow to relieve pain.  Taking NSAIDs to reduce pain and swelling.  Doing exercises (physical therapy) as instructed by your health care provider. Follow these instructions at home: Managing pain, stiffness, and swelling   If directed, put ice on the injured area. ? Put ice in a plastic bag. ? Place a towel between your skin and the bag. ? Leave the ice on for 20 minutes, 2-3 times a day.  Move your fingers often to reduce stiffness and swelling.  Raise (elevate) the injured area above the level of your heart while you are sitting or lying down. Activity  Rest as told by your health care provider.  Do not lift anything that is heavier than the limit that you are told, until your health care provider says that it is safe.  Return to your normal activities as told by your health care provider. Ask your health care provider what activities are safe for you.  Do exercises as told by your health care provider. General instructions  Take over-the-counter and prescription medicines only as instructed by your health care provider.  Do not  use any products that contain nicotine or tobacco, such as cigarettes, e-cigarettes, and chewing tobacco. These can delay healing. If you need help quitting, ask your health care provider.  Keep all follow-up visits as instructed by your health care provider. This is important. How is this prevented?  Warm up and stretch before being active.  Cool down and stretch after being  active.  Give your body time to rest between periods of activity.  Make sure to use equipment that fits you.  Maintain physical fitness, including: ? Strength. ? Flexibility. ? Cardiovascular fitness. ? Endurance. Contact a health care provider if:  Your pain does not improve or it gets worse. Get help right away if:  You have severe pain, swelling, or numbness in your elbow, arm, or hand.  You cannot move your arm.  Your elbow looks like it has the wrong shape (looks deformed) or feels like it is sliding out of place. Summary  Climber's elbow is an elbow injury that results from long-lasting (chronic) inflammation or strain of the muscles in your upper arms (brachialis muscles).  This type of injury develops gradually over time (overuse injury) from repeated overstretching (overextension) or repetitive, forceful flexing (bending) of your upper arm muscles.  This condition is diagnosed based on your symptoms, your medical history, and a physical exam.  Return to your normal activities as told by your health care provider. Ask your health care provider what activities are safe for you. This information is not intended to replace advice given to you by your health care provider. Make sure you discuss any questions you have with your health care provider. Document Revised: 09/23/2018 Document Reviewed: 09/24/2018 Elsevier Patient Education  2020 [employer].

## 2020-03-24 ENCOUNTER — Encounter: Payer: Self-pay | Admitting: Family Medicine

## 2020-03-25 ENCOUNTER — Encounter: Payer: Self-pay | Admitting: Family Medicine

## 2020-03-25 NOTE — Telephone Encounter (Signed)
Out of work per Sunday.

## 2020-03-25 NOTE — Telephone Encounter (Signed)
Patient is calling and stated that he went to work today but if he could get the note stating to be off Saturday and Sunday and return to work on 3/17. CB is [phone]

## 2020-03-29 ENCOUNTER — Ambulatory Visit (INDEPENDENT_AMBULATORY_CARE_PROVIDER_SITE_OTHER): Payer: Managed Care, Other (non HMO) | Admitting: Family Medicine

## 2020-03-29 ENCOUNTER — Encounter: Payer: Self-pay | Admitting: Family Medicine

## 2020-03-29 ENCOUNTER — Ambulatory Visit: Payer: Self-pay

## 2020-03-29 ENCOUNTER — Other Ambulatory Visit: Payer: Self-pay

## 2020-03-29 VITALS — BP 122/82 | HR 82 | Ht 65.0 in | Wt 208.0 lb

## 2020-03-29 DIAGNOSIS — G5631 Lesion of radial nerve, right upper limb: Secondary | ICD-10-CM

## 2020-03-29 DIAGNOSIS — M6790 Unspecified disorder of synovium and tendon, unspecified site: Secondary | ICD-10-CM | POA: Diagnosis not present

## 2020-03-29 DIAGNOSIS — M25521 Pain in right elbow: Secondary | ICD-10-CM

## 2020-03-29 MED ORDER — PREDNISONE 5 MG PO TABS: ORAL_TABLET | ORAL | 0 refills | Status: DC

## 2020-03-29 NOTE — Assessment & Plan Note (Signed)
Symptoms seem more associated with radial tunnel syndrome.  Pain is not severe.  No changes observed over the epicondyle or within the joint.  Possible for radiculopathy as well. -Counseled on home exercise therapy and supportive care. -Prednisone and stop meloxicam. -Counseled on compression. -Could consider physical therapy, imaging of the elbow or neck or nerve block.

## 2020-03-29 NOTE — Patient Instructions (Signed)
Nice to meet you Please stop the mobic while on the prednisone  Please try the exercises  You can try a compression sleeve  I'll be in touch about your thumb  Please send me a message in MyChart with any questions or updates.  Please see me back in 4 weeks.   --Dr. Jordan Likes

## 2020-03-29 NOTE — Progress Notes (Signed)
Jeffrey Berger - 29 y.o. male MRN [id number]  Date of birth: 1990-11-28  SUBJECTIVE:  Including CC & ROS.  Chief Complaint  Patient presents with  . Arm Pain    right x 1 month    Jeffrey Berger is a 29 y.o. male that is presenting with right elbow pain.  The pain is gotten worse over the past week.  He feels it with bending and lifting.  He has tried ice and meloxicam with limited improvement.  No specific inciting event.  Seems occur at the midportion of the humerus and extend down to the midportion of forearm.  Denies any history of surgery.  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  He is unable to flex his tendon.  This occurred at 2013.   Review of Systems See HPI   HISTORY: Past Medical, Surgical, Social, and Family History Reviewed & Updated per EMR.   Pertinent Historical Findings include:  No past medical history on file.  No past surgical history on file.  Family History  Problem Relation Age of Onset  . Anxiety disorder Mother   . Diabetes Father   . Cancer Maternal Grandfather     Social History   Socioeconomic History  . Marital status: Single    Spouse name: Not on file  . Number of children: Not on file  . Years of education: Not on file  . Highest education level: Not on file  Occupational History  . Not on file  Tobacco Use  . Smoking status: Never Smoker  . Smokeless tobacco: Never Used  Substance and Sexual Activity  . Alcohol use: Yes    Comment: rarely for special occassions only  . Drug use: Never  . Sexual activity: Yes  Other Topics Concern  . Not on file  Social History Narrative  . Not on file   Social Determinants of Health   Financial Resource Strain:   . Difficulty of Paying Living Expenses:   Food Insecurity:   . Worried About Programme researcher, broadcasting/film/video in the Last Year:   . Barista in the Last Year:   Transportation Needs:   . Freight forwarder (Medical):   Marland Kitchen Lack of Transportation  (Non-Medical):   Physical Activity:   . Days of Exercise per Week:   . Minutes of Exercise per Session:   Stress:   . Feeling of Stress :   Social Connections:   . Frequency of Communication with Friends and Family:   . Frequency of Social Gatherings with Friends and Family:   . Attends Religious Services:   . Active Member of Clubs or Organizations:   . Attends Banker Meetings:   Marland Kitchen Marital Status:   Intimate Partner Violence:   . Fear of Current or Ex-Partner:   . Emotionally Abused:   Marland Kitchen Physically Abused:   . Sexually Abused:      PHYSICAL EXAM:  VS: BP 122/82   Pulse 82   Ht 5\' 5"  (1.651 m)   Wt 208 lb (94.3 kg)   BMI 34.61 kg/m  Physical Exam Gen: NAD, alert, cooperative with exam, well-appearing MSK:  Right elbow: Tenderness palpation over the supinator. Normal strength resistance with flexion extension. Range of motion. Pain with resistance to supination. Left thumb: Unable to flex the thumb. Neurovascular intact   Limited ultrasound: Right elbow, left thumb:  Right elbow: No changes over the lateral epicondyle and common extensors. Supinator appears to be thickened  when compared to the contralateral side. No changes of the radial nerve at the level of the antecubital fossa.  Left thumb: Absent flexor tendon through the digit.  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  Pain is not severe.  No changes observed over the epicondyle or within the joint.  Possible for radiculopathy as well. -Counseled on home exercise therapy and supportive care. -Prednisone and stop meloxicam. -Counseled on compression. -Could consider physical therapy, imaging of the elbow or neck or nerve block.  Deficit of flexor  tendon He had laceration of the flexor tendon in 2013.  He has had a deficit since that time.  He would like to try surgery to get flexion back.

## 2020-03-29 NOTE — Assessment & Plan Note (Signed)
He had laceration of the flexor tendon in 2013.  He has had a deficit since that time.  He would like to try surgery to get flexion back.

## 2020-04-19 ENCOUNTER — Ambulatory Visit: Payer: Managed Care, Other (non HMO) | Admitting: Family Medicine

## 2020-04-20 ENCOUNTER — Telehealth: Payer: Self-pay | Admitting: Family Medicine

## 2020-04-20 ENCOUNTER — Encounter: Payer: Self-pay | Admitting: Family Medicine

## 2020-04-20 NOTE — Telephone Encounter (Signed)
Patient calling to see if he can get a work note to be excused from work for the next three days due to the pain in his arm. Patient would like work note sent through [employer]

## 2020-04-20 NOTE — Telephone Encounter (Signed)
Provided work note.   Myra Rude, MD [facility] 04/20/2020, 3:48 PM

## 2020-04-26 ENCOUNTER — Ambulatory Visit: Payer: Managed Care, Other (non HMO) | Admitting: Family Medicine

## 2020-05-03 ENCOUNTER — Ambulatory Visit: Payer: Managed Care, Other (non HMO) | Admitting: Family Medicine

## 2020-09-27 ENCOUNTER — Encounter: Payer: Self-pay | Admitting: Family Medicine

## 2020-12-26 ENCOUNTER — Other Ambulatory Visit: Payer: Self-pay

## 2020-12-27 ENCOUNTER — Ambulatory Visit (INDEPENDENT_AMBULATORY_CARE_PROVIDER_SITE_OTHER): Payer: 59 | Admitting: Family Medicine

## 2020-12-27 ENCOUNTER — Encounter: Payer: Self-pay | Admitting: Family Medicine

## 2020-12-27 VITALS — BP 112/74 | HR 93 | Temp 98.3°F | Ht 65.0 in | Wt 205.6 lb

## 2020-12-27 DIAGNOSIS — Z23 Encounter for immunization: Secondary | ICD-10-CM | POA: Diagnosis not present

## 2020-12-27 DIAGNOSIS — Z0000 Encounter for general adult medical examination without abnormal findings: Secondary | ICD-10-CM

## 2020-12-27 NOTE — Progress Notes (Signed)
[facility] [address] [place] [address] Dept: [phone] Dept Fax: [phone]  Annual Wellness Office Visit  Subjective:    Patient ID: Jeffrey Berger, male    DOB: 06/19/1991, 30 y.o..   MRN: [id number]  Chief Complaint  Patient presents with  . Annual Exam    CPE, no concerns.     History of Present Illness:  Patient is in today for an annual wellness visit. Mr. Reiling has no chronic health conditions. He notes he had COVID-19 about a month ago (per home test and PCR). He was sick for only 2 days with chills and cough, but had complete resolution of the illness.  Mr. Want is married, but without children. He works for a [employer], clinical (histocompatibility and immunogenetics). He notes his job is very physical. He deos not necessarily exercise outside of work. He lost some weight early on in the pandemic and has kept this off. This was partially due to dietary changes he made. He does not use tobacco.  Past Medical History: Patient Active Problem List   Diagnosis Date Noted  . 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 Healthcare maintenance 12/29/2019  . Need for influenza vaccination 12/29/2019  . Need for Tdap vaccination 12/29/2019   No past surgical history on file.  Family History  Problem Relation Age of Onset  . Anxiety disorder Mother   . Diabetes Father   . Cancer Maternal Grandfather    Outpatient Medications Prior to Visit  Medication Sig Dispense Refill  . meloxicam (MOBIC) 7.5 MG tablet Take 1 tablet (7.5 mg total) by mouth daily. 30 tablet 0  . naproxen (NAPROSYN) 500 MG tablet Take 1 tablet (500 mg total) by mouth 2 (two) times daily. (Patient not taking: Reported on 03/22/2020) 20 tablet 0  . predniSONE (DELTASONE) 5 MG tablet Take 6 pills for first day, 5 pills second day, 4 pills third day, 3 pills fourth day, 2 pills the fifth day, and 1 pill sixth day.  21 tablet 0   No facility-administered medications prior to visit.   No Known Allergies    Objective:   Today's Vitals   12/27/20 1312  BP: 112/74  Pulse: 93  Temp: 98.3 F (36.8 C)  TempSrc: Temporal  SpO2: 96%  Weight: 205 lb 9.6 oz (93.3 kg)  Height: 5\' 5"  (1.651 m)   Body mass index is 34.21 kg/m.   General: Well developed, well nourished. No acute distress. HEENT: Normocephalic, non-traumatic. PERRL, EOMI. Conjunctiva clear. External ears normal. EAC and TMs   normal bilaterally. Nose clear without congestion or rhinorrhea. Mucous membranes moist. Oropharynx clear.   Good dentition. Neck: Supple. No lymphadenopathy. No thyromegaly. Lungs: Clear to auscultation bilaterally. CV: RRR without murmurs or rubs. Pulses 2+ bilaterally. Abdomen: Soft, non-tender. No hepatosplenomegaly. No rebound or guarding. Extremities: Full ROM. No joint swelling or tenderness. No edema noted. Skin: Warm and dry. Mild scaliness on hands near web spaces. No redness. Psych: Alert and oriented. Normal mood and affect.  Health Maintenance Due  Topic Date Due  . Hepatitis C Screening  Never done     Assessment & Plan:   1. Healthcare maintenance Provide counseling about accident prevention, diet and exercise for weight control, and reviewed current health screenings. No labs indicated today. Reassess in 1 year.  2. Need for influenza vaccination  - Flu Vaccine QUAD 6+ mos PF IM (Fluarix Quad PF)     Freddie Apley, MD

## 2021-01-03 ENCOUNTER — Encounter: Payer: Managed Care, Other (non HMO) | Admitting: Family Medicine

## 2021-11-17 ENCOUNTER — Telehealth: Payer: Self-pay

## 2021-11-17 NOTE — Telephone Encounter (Signed)
Pt called back and Tequila was rooming a pt. She is going to call pt back for this issue.

## 2021-11-17 NOTE — Telephone Encounter (Signed)
Patient scheduled appointment for annual via Mychart and mentioned discussing a episode that he experienced with blacking out in comments for scheduling his appointment. Tried reaching patient to check on him and symptoms no answer LMTCB.

## 2021-11-22 NOTE — Telephone Encounter (Signed)
Spoke with patient regarding concerns about episode of possibly passing out per patient he have not had any other issues or symptoms he feels like he might have had to much caffeine with alcohol to cause the blacking out episode that he had. Advised patient to keep continue to keep an eye on symptoms if this happens again he will need to be seen at urgent care or hospital right away and if he starts with in none life threatening symptoms prior to his appointment to give [place] a call.

## 2021-12-01 ENCOUNTER — Ambulatory Visit: Payer: 59 | Admitting: Family Medicine

## 2021-12-08 ENCOUNTER — Ambulatory Visit: Payer: 59 | Admitting: Family Medicine

## 2021-12-18 ENCOUNTER — Ambulatory Visit: Payer: 59 | Admitting: Family Medicine

## 2021-12-29 ENCOUNTER — Encounter: Payer: 59 | Admitting: Family Medicine

## 2021-12-29 ENCOUNTER — Ambulatory Visit: Payer: 59 | Admitting: Family Medicine

## 2023-02-25 ENCOUNTER — Encounter: Payer: Self-pay | Admitting: *Deleted

## 2023-10-29 ENCOUNTER — Telehealth: Payer: Self-pay

## 2023-10-29 NOTE — Transitions of Care (Post Inpatient/ED Visit) (Unsigned)
   10/29/2023  Name: Rande Kerstein MRN: [id number] DOB: 1991-01-02  Today's TOC FU Call Status: Today's TOC FU Call Status:: Unsuccessful Call (1st Attempt) Unsuccessful Call (1st Attempt) Date: 10/29/23  Attempted to reach the patient regarding the most recent Inpatient/ED visit.  Follow Up Plan: Additional outreach attempts will be made to reach the patient to complete the Transitions of Care (Post Inpatient/ED visit) call.   Signature Arvil Persons, BSN, Charity fundraiser

## 2023-10-30 NOTE — Transitions of Care (Post Inpatient/ED Visit) (Signed)
   10/30/2023  Name: Santanna Scarce MRN: [id number] DOB: 1991/05/12  Today's TOC FU Call Status: Today's TOC FU Call Status:: Successful TOC FU Call Completed Unsuccessful Call (1st Attempt) Date: 10/29/23 [facility] FU Call Complete Date: 10/30/23 Patient's Name and Date of Birth confirmed.  Transition Care Management Follow-up Telephone Call Date of Discharge: 10/28/23 Discharge Facility: Other [facility]) Name of Other (Non[facility]) Discharge Facility: [facility] ED Type of Discharge: Emergency Department How have you been since you were released from the hospital?: Better Any questions or concerns?: No  Items Reviewed: Did you receive and understand the discharge instructions provided?: Yes Any new allergies since your discharge?: No Dietary orders reviewed?: NA Do you have support at home?: Yes  Medications Reviewed Today: Medications Reviewed Today     Reviewed by Larey Dresser, RN (Registered Nurse) on 10/30/23 at 1110  Med List Status: <None>   Medication Order Taking? Sig Documenting Provider Last Dose Status Informant           No Medications to Display                            Home Care and Equipment/Supplies: Were Home Health Services Ordered?: NA Any new equipment or medical supplies ordered?: NA  Functional Questionnaire: Do you need assistance with bathing/showering or dressing?: No Do you need assistance with meal preparation?: No Do you need assistance with eating?: No Do you have difficulty maintaining continence: No Do you need assistance with getting out of bed/getting out of a chair/moving?: No Do you have difficulty managing or taking your medications?: No  Follow up appointments reviewed: PCP Follow-up appointment confirmed?: NA Specialist Hospital Follow-up appointment confirmed?: NA Do you need transportation to your follow-up appointment?: No Do you understand care options if your condition(s) worsen?: Yes-patient verbalized  understanding    SIGNATURE Arvil Persons, BSN, RN
